# Patient Record
Sex: Female | Born: 2000 | Race: White | Hispanic: No | Marital: Single | Smoking: Never smoker
Health system: Southern US, Community
[De-identification: ages and names within clinical notes are randomized; demographics above are authoritative.]

## PROBLEM LIST (undated history)

## (undated) DIAGNOSIS — J45909 Unspecified asthma, uncomplicated: Secondary | ICD-10-CM

## (undated) HISTORY — DX: Unspecified asthma, uncomplicated: J45.909

---
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
## 2018-04-11 ENCOUNTER — Ambulatory Visit
Admission: RE | Admit: 2018-04-11 | Discharge: 2018-04-11 | Disposition: A | Discharge status: Home / Self Care | Payer: BLUE CROSS/BLUE SHIELD | Source: Ambulatory Visit | Attending: Pediatrics | Admitting: Pediatrics

## 2018-04-11 ENCOUNTER — Other Ambulatory Visit: Payer: Self-pay | Admitting: Pediatrics

## 2018-04-11 DIAGNOSIS — R059 Cough, unspecified: Secondary | ICD-10-CM

## 2018-04-11 DIAGNOSIS — R062 Wheezing: Secondary | ICD-10-CM

## 2018-04-11 DIAGNOSIS — R05 Cough: Secondary | ICD-10-CM

## 2018-04-17 ENCOUNTER — Encounter: Payer: Self-pay | Admitting: Allergy & Immunology

## 2018-04-17 ENCOUNTER — Ambulatory Visit (INDEPENDENT_AMBULATORY_CARE_PROVIDER_SITE_OTHER): Payer: BLUE CROSS/BLUE SHIELD | Admitting: Allergy & Immunology

## 2018-04-17 VITALS — BP 110/68 | HR 85 | Temp 98.0°F | Resp 18 | Ht 68.0 in | Wt 155.2 lb

## 2018-04-17 DIAGNOSIS — J302 Other seasonal allergic rhinitis: Secondary | ICD-10-CM | POA: Diagnosis not present

## 2018-04-17 DIAGNOSIS — J4540 Moderate persistent asthma, uncomplicated: Secondary | ICD-10-CM

## 2018-04-17 DIAGNOSIS — J3089 Other allergic rhinitis: Secondary | ICD-10-CM

## 2018-04-17 MED ORDER — CETIRIZINE HCL 10 MG PO TABS: 10.0000 mg | ORAL_TABLET | Freq: Every day | ORAL | 5 refills | Status: DC

## 2018-04-17 MED ORDER — MONTELUKAST SODIUM 10 MG PO TABS: 10.0000 mg | ORAL_TABLET | Freq: Every day | ORAL | 5 refills | Status: DC

## 2018-04-17 MED ORDER — BUDESONIDE-FORMOTEROL FUMARATE 160-4.5 MCG/ACT IN AERO: 2.0000 | INHALATION_SPRAY | Freq: Two times a day (BID) | RESPIRATORY_TRACT | 5 refills | Status: DC

## 2018-04-17 NOTE — Progress Notes (Signed)
NEW PATIENT  Date of Service/Encounter:  04/17/18  Referring provider: Dion Body, MD   Assessment:   Moderate persistent asthma, uncomplicated  Seasonal and perennial allergic rhinitis    Asthma Reportables:  Severity: moderate persistent  Risk: high Control: not well controlled   Plan/Recommendations:   1. Moderate persistent asthma, uncomplicated - Lung testing looks slightly lower today, but there was an improvement with the nebulizer treatment. - Because you are needing albuterol so frequently, we are going to go ahead and start a combined inhaled steroid and long-acting albuterol: Symbicort  - This will help decrease the number of albuterol doses you need each day. - Spacer sample and demonstration provided. - Daily controller medication(s): Symbicort 160/4.73mg two puffs twice daily with spacer - Prior to physical activity: ProAir 2 puffs 10-15 minutes before physical activity. - Rescue medications: ProAir 4 puffs every 4-6 hours as needed - Asthma control goals:  * Full participation in all desired activities (may need albuterol before activity) * Albuterol use two time or less a week on average (not counting use with activity) * Cough interfering with sleep two time or less a month * Oral steroids no more than once a year * No hospitalizations  2. Chronic rhinitis - Testing today showed: horse, mouse, mixed feather, trees, weeds, grasses, indoor molds, dust mites, cat and dog - Avoidance measures provided. - Start taking: Singulair (montelukast) 128mdaily and Flonase (fluticasone) two sprays per nostril daily and Zyrtec (cetirizine) 1078maily as needed.  - You can use an extra dose of the antihistamine, if needed, for breakthrough symptoms.  - Consider nasal saline rinses 1-2 times daily to remove allergens from the nasal cavities as well as help with mucous clearance (this is especially helpful to do before the nasal sprays are given) - Consider allergy  shots as a means of long-term control. - Allergy shots "re-train" and "reset" the immune system to ignore environmental allergens and decrease the resulting immune response to those allergens (sneezing, itchy watery eyes, runny nose, nasal congestion, etc).    - Allergy shots improve symptoms in 75-85% of patients.  - We can discuss more at the next appointment if the medications are not working for you.  3. Return in about 1 month (around 05/15/2018) at 3:45pm.  Subjective:   HanXUAN MATEUS a [age]o. female presenting today for evaluation of  Chief Complaint  Patient presents with  . Asthma  . Snoring  . Headache  . Nasal Congestion    HanIZABELLAH DADISMANs a history of the following: Patient Active Problem List   Diagnosis Date Noted  . Seasonal and perennial allergic rhinitis 04/17/2018  . Moderate persistent asthma, uncomplicated 04/21/83/1660 History obtained from: chart review and patient and her mother.  HanMarya Landrys referred by ReiDion BodyD.     HanAshiah a [age] female presenting for an evaluation of asthma and allergies.   Asthma/Respiratory Symptom History: She was diagnosed when she was around [age] years of age. She received a nebulizer at that time. She did go to the ED several times during weather changes. There was one episode when she needed an ambulance. Overall symptoms have improved. She is now using her albuterol up to three times daily on a good day. She takes it everywhere with her. She was on Qvar at one point when she was in middle school and she "did not like that one". She was even on Advair at some  point sometime around middle school. She just received prednisone for three days before this visit. She reports coughing more nights than not.   Allergic Rhinitis Symptom History: Lately she has had snoring and congestion. The snoring has worsened over the last two years. Patient reports poor sleep. She did go to see ENT (Dr. Polly Cobia). She did have her  adenoids evaluated and this was normal. She was diagnosed with a slightly deviated septum and chronic sinusitis. She was treated with Augmentin. Symptoms persist throughout the entire year. She was tested at one point, although she cannot remember when this happened. It was at an allergy appointment in [place]. She was on allergy shots for one year, but she is unsure how far along she got with this. She is unsure whether they helped at all.   Otherwise, there is no history of other atopic diseases, including drug allergies, stinging insect allergies, or urticaria. There is no significant infectious history. Vaccinations are up to date.   She currently attends [employer]. She is majoring in nursing.    Past Medical History: Patient Active Problem List   Diagnosis Date Noted  . Seasonal and perennial allergic rhinitis 04/17/2018  . Moderate persistent asthma, uncomplicated 65/99/1992    Medication List:  Allergies as of 04/17/2018   No Known Allergies     Medication List        Accurate as of 04/17/18 12:45 PM. Always use your most recent med list.          albuterol 108 (90 Base) MCG/ACT inhaler Commonly known as:  PROVENTIL HFA;VENTOLIN HFA Inhale into the lungs.   budesonide-formoterol 160-4.5 MCG/ACT inhaler Commonly known as:  SYMBICORT Inhale 2 puffs into the lungs 2 (two) times daily.   cetirizine 10 MG tablet Commonly known as:  ZYRTEC Take 1 tablet (10 mg total) by mouth daily.   fluticasone 50 MCG/ACT nasal spray Commonly known as:  FLONASE SHAKE LQ AND U 2 SPRAYS IEN D   montelukast 10 MG tablet Commonly known as:  SINGULAIR Take 1 tablet (10 mg total) by mouth at bedtime.       Birth History: non-contributory. Born at term without complications.   Developmental History: Andria has met all milestones on time. She has required no speech therapy, occupational therapy, or physical therapy.   Past Surgical History: History reviewed. No pertinent  surgical history.   Family History: Family History  Problem Relation Age of Onset  . Asthma Mother   . Allergic rhinitis Mother   . Allergic rhinitis Father   . Asthma Sister   . Allergic rhinitis Sister      Social History: Sydelle lives at home with her mother, father, and dogs. They moved to a different house in the recent past. She has the snoring at her house as well as her apartment in college. They currently live in a house that is 19 years old. There is hardwood throughout the home and carpeting in the bedrooms. They have gas heating and central cooling. There are five dogs in the home, and one of them goes into the bedrooms occasionally. There are no dust mite coverings of the bedding. There is no tobacco exposure in the home. She is currently pursuing a degree in nursing from [employer].     Review of Systems: a 14-point review of systems is pertinent for what is mentioned in HPI.  Otherwise, all other systems were negative. Constitutional: negative other than that listed in the HPI Eyes: negative other than that  listed in the HPI Ears, nose, mouth, throat, and face: negative other than that listed in the HPI Respiratory: negative other than that listed in the HPI Cardiovascular: negative other than that listed in the HPI Gastrointestinal: negative other than that listed in the HPI Genitourinary: negative other than that listed in the HPI Integument: negative other than that listed in the HPI Hematologic: negative other than that listed in the HPI Musculoskeletal: negative other than that listed in the HPI Neurological: negative other than that listed in the HPI Allergy/Immunologic: negative other than that listed in the HPI    Objective:   Blood pressure 110/68, pulse 85, temperature 98 F (36.7 C), resp. rate 18, height '5\' 8"'  (1.727 m), weight 155 lb 3.2 oz (70.4 kg), last menstrual period 03/22/2018, SpO2 95 %. Body mass index is 23.6 kg/m.   Physical  Exam:  General: Alert, interactive, in no acute distress. Pleasant female. Laughing.  Eyes: No conjunctival injection bilaterally, no discharge on the right, no discharge on the left, no Horner-Trantas dots present and allergic shiners present bilaterally. PERRL bilaterally. EOMI without pain. No photophobia.  Ears: Right TM pearly gray with normal light reflex, Left TM pearly gray with normal light reflex, Right TM intact without perforation and Left TM intact without perforation.  Nose/Throat: External nose within normal limits and septum midline. Turbinates edematous and pale with clear discharge. Posterior oropharynx markedly erythematous with cobblestoning in the posterior oropharynx. Tonsils 2+ without exudates.  Tongue without thrush. Neck: Supple without thyromegaly. Trachea midline. Adenopathy: shoddy bilateral anterior cervical lymphadenopathy and no enlarged lymph nodes appreciated in the occipital, axillary, epitrochlear, inguinal, or popliteal regions. Lungs: Clear to auscultation without wheezing, rhonchi or rales. No increased work of breathing. CV: Normal S1/S2. No murmurs. Capillary refill <2 seconds.  Abdomen: Nondistended, nontender. No guarding or rebound tenderness. Bowel sounds present in all fields and hypoactive  Skin: Warm and dry, without lesions or rashes. Extremities:  No clubbing, cyanosis or edema. Neuro:   Grossly intact. No focal deficits appreciated. Responsive to questions.  Diagnostic studies:   Spirometry: results abnormal (FEV1: 2.46/65%, FVC: 3.85/89%, FEV1/FVC: 64%).    Spirometry consistent with mild obstructive disease. Albuterol nebulizer treatment given in clinic with improvement in FEV1, but not significant per ATS criteria.  Allergy Studies:   Indoor/Outdoor Percutaneous Adult Environmental Panel: positive to bahia grass, johnson grass, [place] blue grass, meadow fescue grass, perennial rye grass, timothy grass, burweed marsh elder, short ragweed,  lamb's quarters, red cedar, [place] cottonwood, [place] sycamore, black walnut pollen, cat, dog, mixed feather, horse and mouse. Otherwise negative with adequate controls.  Indoor/Outdoor Selected Intradermal Environmental Panel: positive to mold mix #2 and mold mix #4. Otherwise negative with adequate controls.    Allergy testing results were read and interpreted by myself, documented by clinical staff.       Salvatore Marvel, MD [facility]

## 2018-04-17 NOTE — Patient Instructions (Addendum)
1. Moderate persistent asthma, uncomplicated - Lung testing looks slightly lower today, but there was an improvement with the nebulizer treatment. - Because you are needing albuterol so frequently, we are going to go ahead and start a combined inhaled steroid and long-acting albuterol: Symbicort  - This will help decrease the number of albuterol doses you need each day. - Spacer sample and demonstration provided. - Daily controller medication(s): Symbicort 160/4.175mcg two puffs twice daily with spacer - Prior to physical activity: ProAir 2 puffs 10-15 minutes before physical activity. - Rescue medications: ProAir 4 puffs every 4-6 hours as needed - Asthma control goals:  * Full participation in all desired activities (may need albuterol before activity) * Albuterol use two time or less a week on average (not counting use with activity) * Cough interfering with sleep two time or less a month * Oral steroids no more than once a year * No hospitalizations  2. Chronic rhinitis - Testing today showed: horse, mouse, mixed feather, trees, weeds, grasses, indoor molds, dust mites, cat and dog - Avoidance measures provided. - Start taking: Singulair (montelukast) 10mg  daily and Flonase (fluticasone) two sprays per nostril daily and Zyrtec (cetirizine) 10mg  daily as needed.  - You can use an extra dose of the antihistamine, if needed, for breakthrough symptoms.  - Consider nasal saline rinses 1-2 times daily to remove allergens from the nasal cavities as well as help with mucous clearance (this is especially helpful to do before the nasal sprays are given) - Consider allergy shots as a means of long-term control. - Allergy shots "re-train" and "reset" the immune system to ignore environmental allergens and decrease the resulting immune response to those allergens (sneezing, itchy watery eyes, runny nose, nasal congestion, etc).    - Allergy shots improve symptoms in 75-85% of patients.  - We can discuss  more at the next appointment if the medications are not working for you.  3. Return in about 1 month (around 05/15/2018) at 3:45pm.   Please inform us of any Emergency Department visits, hospitalizations, or changes in symptoms. Call us before going to the ED for breathing or allergy symptoms since we might be able to fit you in for a sick visit. Feel free to contact us anytime with any questions, problems, or concerns.  It was a pleasure to meet you and your family today!  Websites that have reliable patient information: 1. American Academy of Asthma, Allergy, and Immunology: www.aaaai.org 2. Food Allergy Research and Education (FARE): foodallergy.org 3. Mothers of Asthmatics: http://www.asthmacommunitynetwork.org 4. American College of Allergy, Asthma, and Immunology: MissingWeapons.cawww.acaai.org   Make sure you are registered to vote! If you have moved or changed any of your contact information, you will need to get this updated before voting!        Reducing Pollen Exposure  The American Academy of Allergy, Asthma and Immunology suggests the following steps to reduce your exposure to pollen during allergy seasons.    1. Do not hang sheets or clothing out to dry; pollen may collect on these items. 2. Do not mow lawns or spend time around freshly cut grass; mowing stirs up pollen. 3. Keep windows closed at night.  Keep car windows closed while driving. 4. Minimize morning activities outdoors, a time when pollen counts are usually at their highest. 5. Stay indoors as much as possible when pollen counts or humidity is high and on windy days when pollen tends to remain in the air longer. 6. Use air conditioning when possible.  Many air conditioners have filters that trap the pollen spores. 7. Use a HEPA room air filter to remove pollen form the indoor air you breathe.  Control of Mold Allergen   Mold and fungi can grow on a variety of surfaces provided certain temperature and moisture conditions  exist.  Outdoor molds grow on plants, decaying vegetation and soil.  The major outdoor mold, Alternaria and Cladosporium, are found in very high numbers during hot and dry conditions.  Generally, a late Summer - Fall peak is seen for common outdoor fungal spores.  Rain will temporarily lower outdoor mold spore count, but counts rise rapidly when the rainy period ends.  The most important indoor molds are Aspergillus and Penicillium.  Dark, humid and poorly ventilated basements are ideal sites for mold growth.  The next most common sites of mold growth are the bathroom and the kitchen.  Indoor (Perennial) Mold Control   Positive indoor molds via skin testing: Aspergillus, Penicillium, Fusarium, Aureobasidium (Pullulara) and Rhizopus  1. Maintain humidity below 50%. 2. Clean washable surfaces with 5% bleach solution. 3. Remove sources e.g. contaminated carpets.     Control of Dog or Cat Allergen  Avoidance is the best way to manage a dog or cat allergy. If you have a dog or cat and are allergic to dog or cats, consider removing the dog or cat from the home. If you have a dog or cat but don't want to find it a new home, or if your family wants a pet even though someone in the household is allergic, here are some strategies that may help keep symptoms at bay:  1. Keep the pet out of your bedroom and restrict it to only a few rooms. Be advised that keeping the dog or cat in only one room will not limit the allergens to that room. 2. Don't pet, hug or kiss the dog or cat; if you do, wash your hands with soap and water. 3. High-efficiency particulate air (HEPA) cleaners run continuously in a bedroom or living room can reduce allergen levels over time. 4. Regular use of a high-efficiency vacuum cleaner or a central vacuum can reduce allergen levels. 5. Giving your dog or cat a bath at least once a week can reduce airborne allergen.  Allergy Shots   Allergies are the result of a chain reaction that  starts in the immune system. Your immune system controls how your body defends itself. For instance, if you have an allergy to pollen, your immune system identifies pollen as an invader or allergen. Your immune system overreacts by producing antibodies called Immunoglobulin E (IgE). These antibodies travel to cells that release chemicals, causing an allergic reaction.  The concept behind allergy immunotherapy, whether it is received in the form of shots or tablets, is that the immune system can be desensitized to specific allergens that trigger allergy symptoms. Although it requires time and patience, the payback can be long-term relief.  How Do Allergy Shots Work?  Allergy shots work much like a vaccine. Your body responds to injected amounts of a particular allergen given in increasing doses, eventually developing a resistance and tolerance to it. Allergy shots can lead to decreased, minimal or no allergy symptoms.  There generally are two phases: build-up and maintenance. Build-up often ranges from three to six months and involves receiving injections with increasing amounts of the allergens. The shots are typically given once or twice a week, though more rapid build-up schedules are sometimes used.  The maintenance phase  begins when the most effective dose is reached. This dose is different for each person, depending on how allergic you are and your response to the build-up injections. Once the maintenance dose is reached, there are longer periods between injections, typically two to four weeks.  Occasionally doctors give cortisone-type shots that can temporarily reduce allergy symptoms. These types of shots are different and should not be confused with allergy immunotherapy shots.  Who Can Be Treated with Allergy Shots?  Allergy shots may be a good treatment approach for people with allergic rhinitis (hay fever), allergic asthma, conjunctivitis (eye allergy) or stinging insect allergy.    Before deciding to begin allergy shots, you should consider:  . The length of allergy season and the severity of your symptoms . Whether medications and/or changes to your environment can control your symptoms . Your desire to avoid long-term medication use . Time: allergy immunotherapy requires a major time commitment . Cost: may vary depending on your insurance coverage  Allergy shots for children age 105 and older are effective and often well tolerated. They might prevent the onset of new allergen sensitivities or the progression to asthma.  Allergy shots are not started on patients who are pregnant but can be continued on patients who become pregnant while receiving them. In some patients with other medical conditions or who take certain common medications, allergy shots may be of risk. It is important to mention other medications you talk to your allergist.   When Will I Feel Better?  Some may experience decreased allergy symptoms during the build-up phase. For others, it may take as long as 12 months on the maintenance dose. If there is no improvement after a year of maintenance, your allergist will discuss other treatment options with you.  If you aren't responding to allergy shots, it may be because there is not enough dose of the allergen in your vaccine or there are missing allergens that were not identified during your allergy testing. Other reasons could be that there are high levels of the allergen in your environment or major exposure to non-allergic triggers like tobacco smoke.  What Is the Length of Treatment?  Once the maintenance dose is reached, allergy shots are generally continued for three to five years. The decision to stop should be discussed with your allergist at that time. Some people may experience a permanent reduction of allergy symptoms. Others may relapse and a longer course of allergy shots can be considered.  What Are the Possible Reactions?  The two  types of adverse reactions that can occur with allergy shots are local and systemic. Common local reactions include very mild redness and swelling at the injection site, which can happen immediately or several hours after. A systemic reaction, which is less common, affects the entire body or a particular body system. They are usually mild and typically respond quickly to medications. Signs include increased allergy symptoms such as sneezing, a stuffy nose or hives.  Rarely, a serious systemic reaction called anaphylaxis can develop. Symptoms include swelling in the throat, wheezing, a feeling of tightness in the chest, nausea or dizziness. Most serious systemic reactions develop within 30 minutes of allergy shots. This is why it is strongly recommended you wait in your doctor's office for 30 minutes after your injections. Your allergist is trained to watch for reactions, and his or her staff is trained and equipped with the proper medications to identify and treat them.  Who Should Administer Allergy Shots?  The preferred location for  receiving shots is your prescribing allergist's office. Injections can sometimes be given at another facility where the physician and staff are trained to recognize and treat reactions, and have received instructions by your prescribing allergist.

## 2018-05-15 ENCOUNTER — Ambulatory Visit (INDEPENDENT_AMBULATORY_CARE_PROVIDER_SITE_OTHER): Payer: BLUE CROSS/BLUE SHIELD | Admitting: Allergy & Immunology

## 2018-05-15 ENCOUNTER — Encounter: Payer: Self-pay | Admitting: Allergy & Immunology

## 2018-05-15 VITALS — BP 108/70 | HR 100 | Temp 98.7°F | Resp 16 | Ht 68.0 in | Wt 151.0 lb

## 2018-05-15 DIAGNOSIS — J302 Other seasonal allergic rhinitis: Secondary | ICD-10-CM | POA: Diagnosis not present

## 2018-05-15 DIAGNOSIS — J4540 Moderate persistent asthma, uncomplicated: Secondary | ICD-10-CM

## 2018-05-15 DIAGNOSIS — J3089 Other allergic rhinitis: Secondary | ICD-10-CM | POA: Diagnosis not present

## 2018-05-15 NOTE — Patient Instructions (Addendum)
1. Moderate persistent asthma, uncomplicated - Lung testing looked amazing today. - I am glad that you are feeling better.  - Daily controller medication(s): Symbicort 160/4.465mcg two puffs twice daily with spacer - Prior to physical activity: ProAir 2 puffs 10-15 minutes before physical activity. - Rescue medications: ProAir 4 puffs every 4-6 hours as needed - Asthma control goals:  * Full participation in all desired activities (may need albuterol before activity) * Albuterol use two time or less a week on average (not counting use with activity) * Cough interfering with sleep two time or less a month * Oral steroids no more than once a year * No hospitalizations  2. Chronic rhinitis (horse, mouse, mixed feather, trees, weeds, grasses, indoor molds, dust mites, cat and dog) - Continue taking: Singulair (montelukast) 10mg  daily and Flonase (fluticasone) two sprays per nostril daily and Zyrtec (cetirizine) 10mg  daily as needed.  - You can wean off of these as tolerated, but beware since you are allergic to perennial and seasonal allergens.  - You can use an extra dose of the antihistamine, if needed, for breakthrough symptoms.  - Consider nasal saline rinses 1-2 times daily to remove allergens from the nasal cavities as well as help with mucous clearance (this is especially helpful to do before the nasal sprays are given) - Consider allergy shots as a means of long-term control.  3. Return in about 4 months (around 09/14/2018).   Please inform us of any Emergency Department visits, hospitalizations, or changes in symptoms. Call us before going to the ED for breathing or allergy symptoms since we might be able to fit you in for a sick visit. Feel free to contact us anytime with any questions, problems, or concerns.  It was a pleasure to see you again today! I am glad that you are feeling good today!   Websites that have reliable patient information: 1. American Academy of Asthma, Allergy,  and Immunology: www.aaaai.org 2. Food Allergy Research and Education (FARE): foodallergy.org 3. Mothers of Asthmatics: http://www.asthmacommunitynetwork.org 4. American College of Allergy, Asthma, and Immunology: MissingWeapons.cawww.acaai.org   Make sure you are registered to vote! If you have moved or changed any of your contact information, you will need to get this updated before voting!

## 2018-05-15 NOTE — Progress Notes (Signed)
FOLLOW UP  Date of Service/Encounter:  05/15/18   Assessment:   Moderate persistent asthma, uncomplicated  Seasonal and perennial allergic rhinitis (horse, mouse, mixed feather, trees, weeds, grasses, indoor molds, dust mites, cat and dog)    Asthma Reportables:  Severity: moderate persistent  Risk: high Control: not well controlled   Plan/Recommendations:   1. Moderate persistent asthma, uncomplicated - Lung testing looked amazing today. - I am glad that you are feeling better.  - Daily controller medication(s): Symbicort 160/4.75mcg two puffs twice daily with spacer - Prior to physical activity: ProAir 2 puffs 10-15 minutes before physical activity. - Rescue medications: ProAir 4 puffs every 4-6 hours as needed - Asthma control goals:  * Full participation in all desired activities (may need albuterol before activity) * Albuterol use two time or less a week on average (not counting use with activity) * Cough interfering with sleep two time or less a month * Oral steroids no more than once a year * No hospitalizations  2. Chronic rhinitis (horse, mouse, mixed feather, trees, weeds, grasses, indoor molds, dust mites, cat and dog) - Continue taking: Singulair (montelukast) 10mg  daily and Flonase (fluticasone) two sprays per nostril daily and Zyrtec (cetirizine) 10mg  daily as needed.  - You can wean off of these as tolerated, but beware since you are allergic to perennial and seasonal allergens.  - You can use an extra dose of the antihistamine, if needed, for breakthrough symptoms.  - Consider nasal saline rinses 1-2 times daily to remove allergens from the nasal cavities as well as help with mucous clearance (this is especially helpful to do before the nasal sprays are given) - Consider allergy shots as a means of long-term control.  3. Return in about 4 months (around 09/14/2018).   Subjective:   Lydia Brown is a 17 y.o. female presenting today for follow up of    Chief Complaint  Patient presents with  . Asthma    Lydia DupreHannah J Brown has a history of the following: Patient Active Problem List   Diagnosis Date Noted  . Seasonal and perennial allergic rhinitis 04/17/2018  . Moderate persistent asthma, uncomplicated 04/17/2018    History obtained from: chart review and patient and her mother.  Lydia AlbertsHannah J Brown's Primary Care Provider is Diamantina Monkseid, Maria, MD.     Lydia ClientHannah is a 17 y.o. female presenting for a follow up visit.  She was last seen in July 2019 as a new patient.  At that time, her lung function looked low, but there was improvement with albuterol.  Because she was needing albuterol so frequently, we started Symbicort 160/4.5 mcg 2 puffs twice daily.  She had testing that was positive to horse, mouse, mixed feather, trees, weeds, grasses, indoor molds, dust mites, cat and dog.  We started her on Singulair, Flonase, and Zyrtec.  We did discuss allergy shots as a means of long-term control.  Since the last visit, she has done well. She is using her Symbicort two puffs twice daily.  She has been quite impressed with how well she is feeling on this.  She used her albuterol only one time since I saw her.  She is able to exercise more readily, and in fact is now meeting her mother during the runs.  She has less nighttime coughing.  She has required no prednisone or ER visits.  ACT score today is 16, indicating subpar asthma control.  This is likely related to the fact that the time course of the survey  spans prior to when I first saw her.  Her allergic rhinitis symptoms are controlled with Flonase, cetirizine, and montelukast.  She has thought about allergy shots, and remains interested in them.  However, she is wondering how she will get them when she is at school.  I did spend some time discussing the fact that we can give allergy shots through the health clinic at school.  She seemed interested in this and will consider in the future.  Since last visit, she did  go to [place] with her mother to visit her older sister, who is doing an internship with the [employer].  She will be going back to school this coming week.  Otherwise, there have been no changes to her past medical history, surgical history, family history, or social history.    Review of Systems: a 14-point review of systems is pertinent for what is mentioned in HPI.  Otherwise, all other systems were negative. Constitutional: negative other than that listed in the HPI Eyes: negative other than that listed in the HPI Ears, nose, mouth, throat, and face: negative other than that listed in the HPI Respiratory: negative other than that listed in the HPI Cardiovascular: negative other than that listed in the HPI Gastrointestinal: negative other than that listed in the HPI Genitourinary: negative other than that listed in the HPI Integument: negative other than that listed in the HPI Hematologic: negative other than that listed in the HPI Musculoskeletal: negative other than that listed in the HPI Neurological: negative other than that listed in the HPI Allergy/Immunologic: negative other than that listed in the HPI    Objective:   Blood pressure 108/70, pulse 100, temperature 98.7 F (37.1 C), temperature source Oral, resp. rate 16, height 5\' 8"  (1.727 m), weight 151 lb (68.5 kg), last menstrual period 05/11/2018, SpO2 98 %. Body mass index is 22.96 kg/m.   Physical Exam:  General: Alert, interactive, in no acute distress. Pleasant female. Talkative.  Eyes: No conjunctival injection bilaterally, no discharge on the right, no discharge on the left and no Horner-Trantas dots present. PERRL bilaterally. EOMI without pain. No photophobia.  Ears: Right TM pearly gray with normal light reflex, Left TM pearly gray with normal light reflex, Right TM intact without perforation and Left TM intact without perforation.  Nose/Throat: External nose within normal limits and  septum midline. Turbinates edematous and pale with clear discharge. Posterior oropharynx erythematous without cobblestoning in the posterior oropharynx. Tonsils 2+ without exudates.  Tongue without thrush. Lungs: Clear to auscultation without wheezing, rhonchi or rales. No increased work of breathing. CV: Normal S1/S2. No murmurs. Capillary refill <2 seconds.  Skin: Warm and dry, without lesions or rashes. Neuro:   Grossly intact. No focal deficits appreciated. Responsive to questions.  Diagnostic studies:   Spirometry: results normal (FEV1: 3.95/106%, FVC: 4.89/114%, FEV1/FVC: 81%).    Spirometry consistent with normal pattern.   Allergy Studies: none      Malachi BondsJoel Greysyn Vanderberg, MD  [facility]

## 2018-10-11 ENCOUNTER — Telehealth: Payer: Self-pay

## 2018-10-11 NOTE — Telephone Encounter (Signed)
Patient brought back her forms to start allergy injections. Patient has schedule appt to start allergy injection on 10/23/2018. Can you place the order for these. Thank You.

## 2018-10-15 NOTE — Addendum Note (Signed)
Addended by: Alfonse Spruce on: 10/15/2018 02:37 PM   Modules accepted: Orders

## 2018-10-15 NOTE — Progress Notes (Signed)
We received notification from Lydia Brown that she is interested in pursuing allergen immunotherapy. Prescriptions written and routed to the Immunotherapy Team.   Malachi Bonds, MD [facility]

## 2018-10-16 NOTE — Progress Notes (Signed)
EXP 10/19/19

## 2018-10-19 DIAGNOSIS — J301 Allergic rhinitis due to pollen: Secondary | ICD-10-CM

## 2018-10-20 DIAGNOSIS — J3089 Other allergic rhinitis: Secondary | ICD-10-CM

## 2018-10-23 ENCOUNTER — Ambulatory Visit: Payer: BLUE CROSS/BLUE SHIELD

## 2018-11-24 ENCOUNTER — Ambulatory Visit: Payer: BLUE CROSS/BLUE SHIELD

## 2018-12-22 ENCOUNTER — Telehealth: Payer: Self-pay

## 2018-12-22 ENCOUNTER — Other Ambulatory Visit: Payer: Self-pay | Admitting: Allergy & Immunology

## 2018-12-22 MED ORDER — BUDESONIDE-FORMOTEROL FUMARATE 160-4.5 MCG/ACT IN AERO: 2.0000 | INHALATION_SPRAY | Freq: Two times a day (BID) | RESPIRATORY_TRACT | 0 refills | Status: DC

## 2018-12-22 MED ORDER — MONTELUKAST SODIUM 10 MG PO TABS: 10.0000 mg | ORAL_TABLET | Freq: Every day | ORAL | 0 refills | Status: DC

## 2018-12-22 NOTE — Telephone Encounter (Signed)
Mom called and said pharmacy faxed a request for prescriptions on 12-19-18 and they were never filled. I looked up her last visit and it was 05-15-18. I said that might be the reason. Patient is in [place] waiting on these prescriptions before she comes home. Needs Montelukast and Symbicort sent in to [facility] in [place] on [address].

## 2018-12-22 NOTE — Telephone Encounter (Signed)
Courtesy refill sent in for Montelukast and Symbicort, pt needs to schedule an OV.

## 2018-12-22 NOTE — Telephone Encounter (Signed)
Refill sent in to [facility]

## 2018-12-25 ENCOUNTER — Other Ambulatory Visit: Payer: Self-pay | Admitting: *Deleted

## 2018-12-26 ENCOUNTER — Other Ambulatory Visit: Payer: Self-pay

## 2018-12-26 ENCOUNTER — Ambulatory Visit (INDEPENDENT_AMBULATORY_CARE_PROVIDER_SITE_OTHER): Payer: BLUE CROSS/BLUE SHIELD | Admitting: *Deleted

## 2018-12-26 DIAGNOSIS — J3089 Other allergic rhinitis: Secondary | ICD-10-CM | POA: Diagnosis not present

## 2018-12-26 DIAGNOSIS — J302 Other seasonal allergic rhinitis: Secondary | ICD-10-CM

## 2018-12-26 MED ORDER — EPINEPHRINE 0.3 MG/0.3ML IJ SOAJ: 0.3000 mg | INTRAMUSCULAR | 2 refills | Status: DC | PRN

## 2018-12-26 NOTE — Progress Notes (Signed)
Immunotherapy   Patient Details  Name: Lydia Brown MRN: [id number] Date of Birth: 2001-04-07  12/26/2018  [name] started injections for  G-W-T-C-D/MOLD-RW Following schedule: B  Frequency:2 times per week Epi-Pen:Epi-Pen Available  Consent signed and patient instructions given.   [name] 12/26/2018, 2:27 PM

## 2018-12-26 NOTE — Progress Notes (Signed)
Signed order.  Malachi Bonds, MD [facility]

## 2019-01-23 IMAGING — CR DG CHEST 2V
2 series · 2 of 2 positions shown · non-contrast
Comparison: None.

CLINICAL DATA: Cough and wheezing.

EXAM:
CHEST - 2 VIEW

[w chest pa]
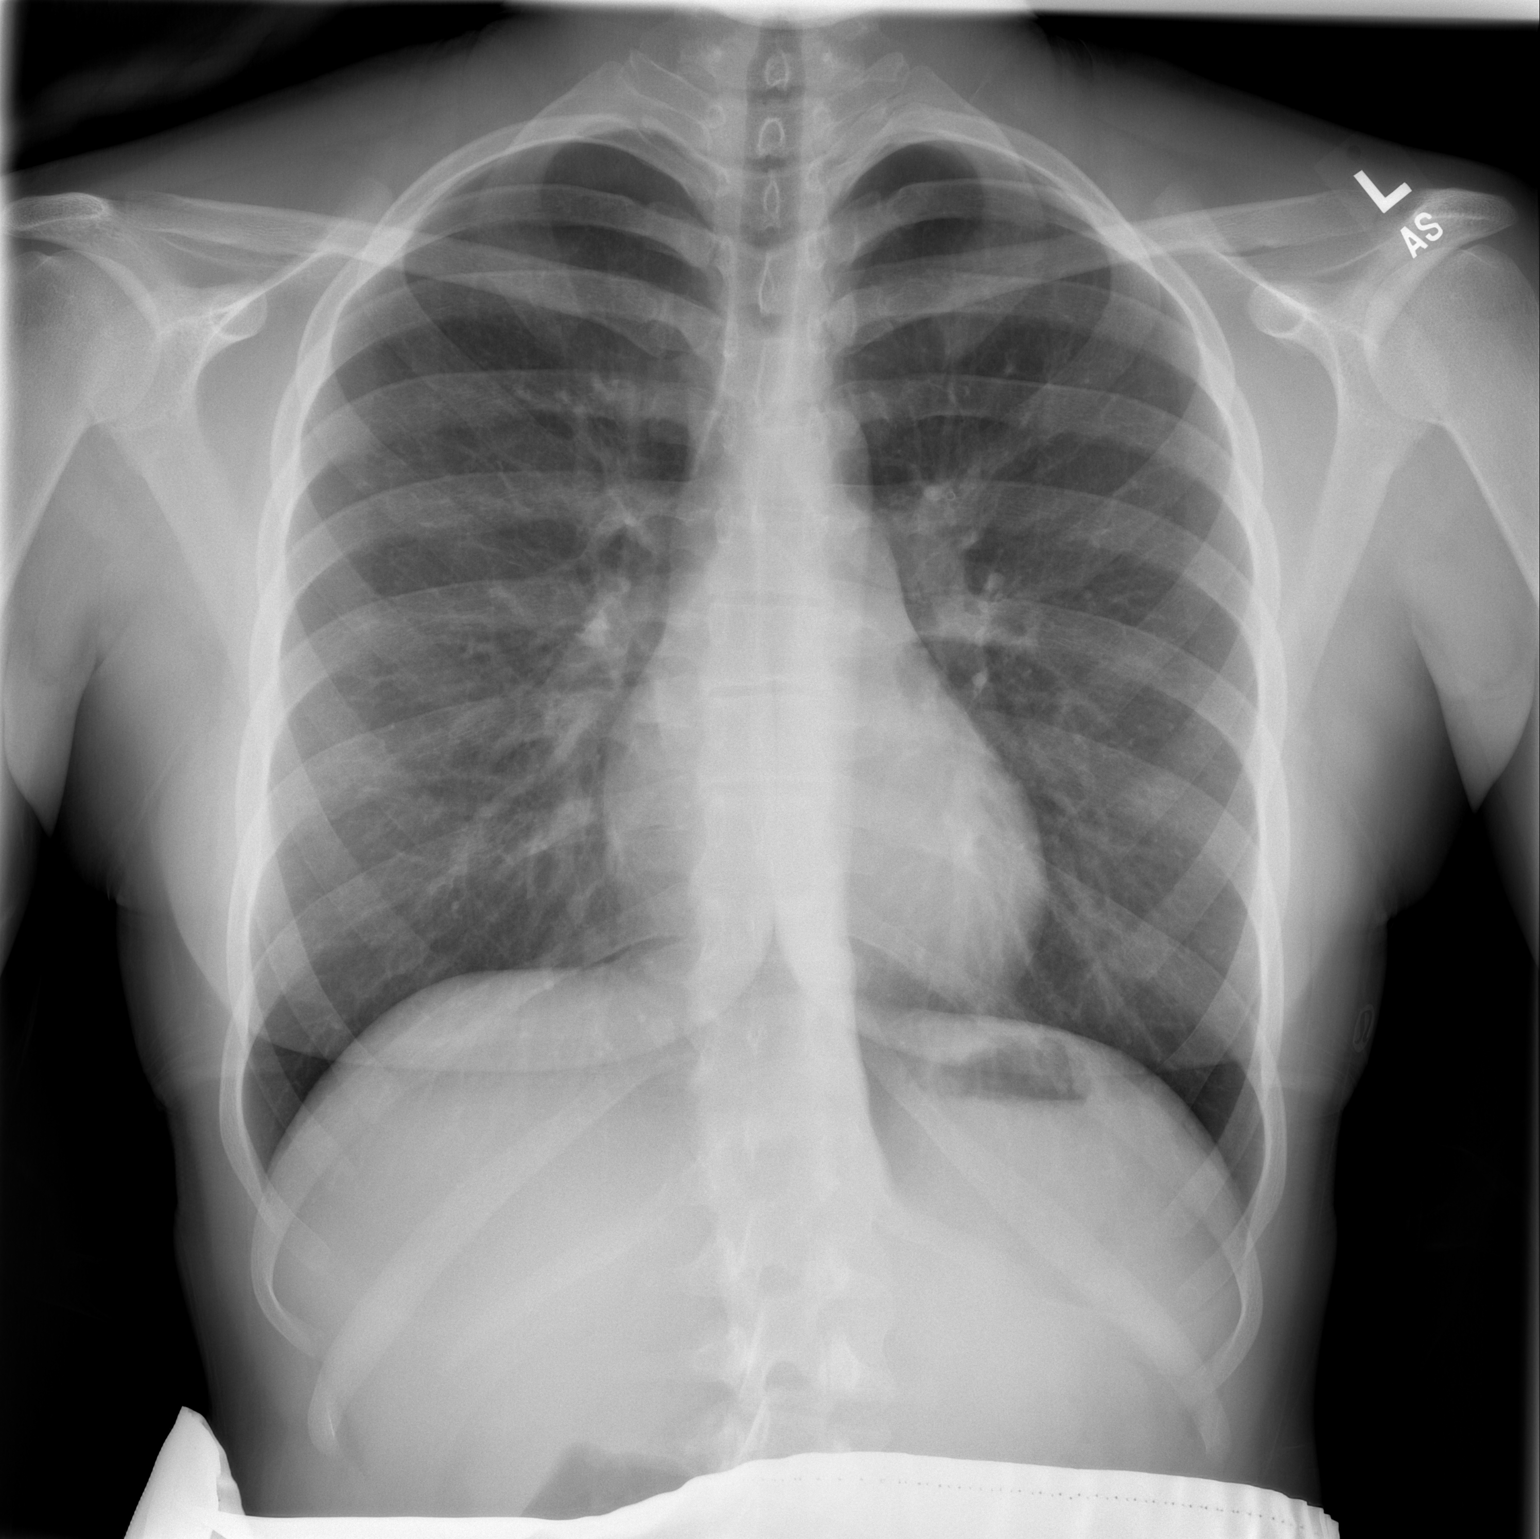

[w chest lat]
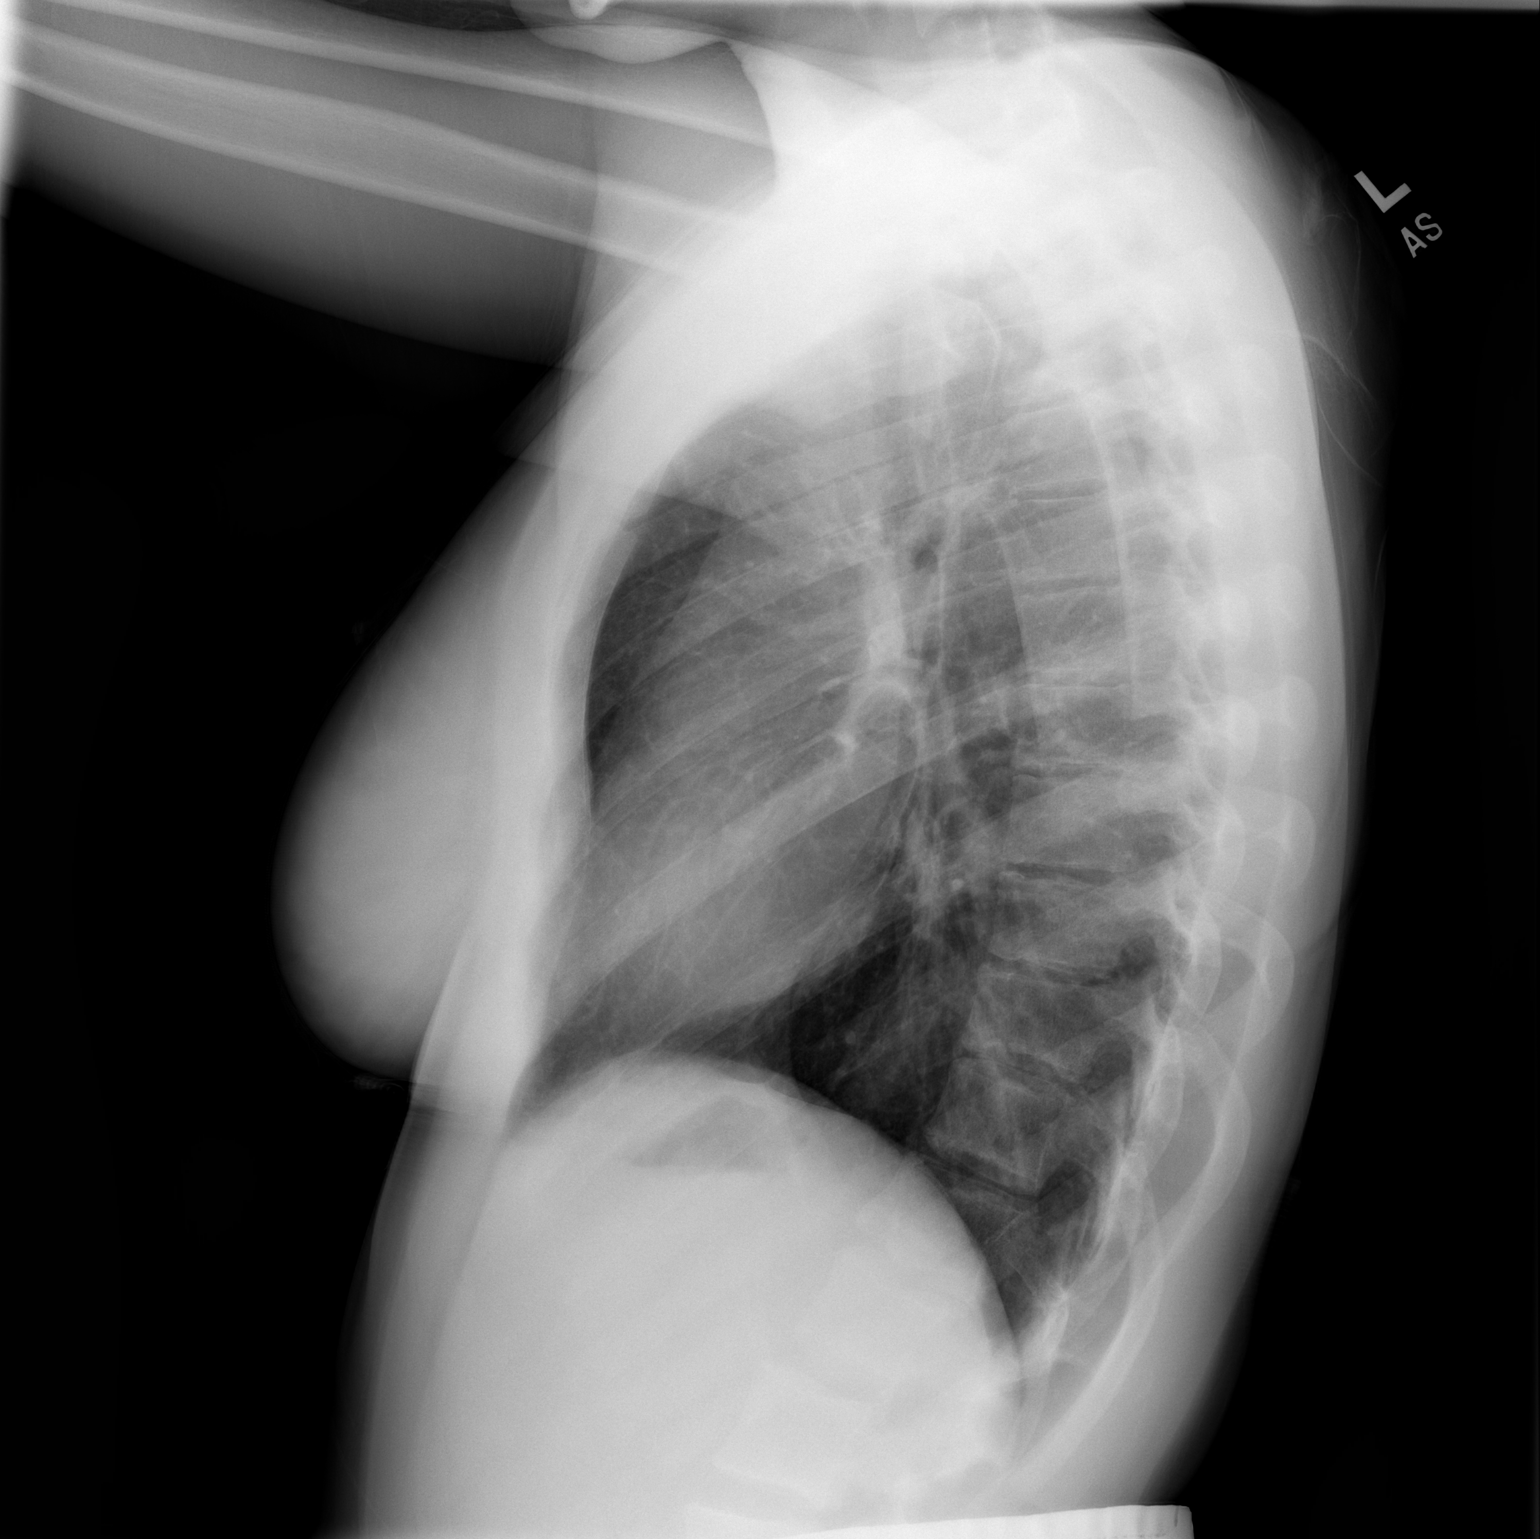

[2 of 2 positions shown; findings below may reference images not displayed]

FINDINGS: The heart size and mediastinal contours are within normal limits.
Both lungs are clear. Pectus deformity noted.
IMPRESSION: No active cardiopulmonary disease.

## 2019-03-29 ENCOUNTER — Ambulatory Visit: Payer: Managed Care, Other (non HMO) | Admitting: Allergy & Immunology

## 2019-03-29 ENCOUNTER — Encounter: Payer: Self-pay | Admitting: Allergy & Immunology

## 2019-03-29 ENCOUNTER — Other Ambulatory Visit: Payer: Self-pay

## 2019-03-29 VITALS — BP 102/80 | HR 80 | Temp 99.2°F | Resp 16 | Ht 67.32 in | Wt 154.0 lb

## 2019-03-29 DIAGNOSIS — J3089 Other allergic rhinitis: Secondary | ICD-10-CM

## 2019-03-29 DIAGNOSIS — J4540 Moderate persistent asthma, uncomplicated: Secondary | ICD-10-CM | POA: Diagnosis not present

## 2019-03-29 DIAGNOSIS — J302 Other seasonal allergic rhinitis: Secondary | ICD-10-CM

## 2019-03-29 MED ORDER — BUDESONIDE-FORMOTEROL FUMARATE 160-4.5 MCG/ACT IN AERO: 2.0000 | INHALATION_SPRAY | Freq: Two times a day (BID) | RESPIRATORY_TRACT | 5 refills | Status: DC

## 2019-03-29 MED ORDER — ALBUTEROL SULFATE HFA 108 (90 BASE) MCG/ACT IN AERS: 2.0000 | INHALATION_SPRAY | RESPIRATORY_TRACT | 1 refills | Status: DC | PRN

## 2019-03-29 MED ORDER — MONTELUKAST SODIUM 10 MG PO TABS: 10.0000 mg | ORAL_TABLET | Freq: Every day | ORAL | 5 refills | Status: DC

## 2019-03-29 NOTE — Progress Notes (Signed)
FOLLOW UP  Date of Service/Encounter:  03/29/19   Assessment:   Moderate persistent asthma, uncomplicated  Seasonal and perennial allergic rhinitis (horse, mouse, mixed feather, trees, weeds, grasses, indoor molds, dust mites, cat and dog)   Plan/Recommendations:   1. Moderate persistent asthma, uncomplicated - Lung testing looked amazing today.  - Daily controller medication(s): Symbicort 160/4.68mcg two puffs twice daily with spacer - Prior to physical activity: ProAir 2 puffs 10-15 minutes before physical activity. - Rescue medications: ProAir 4 puffs every 4-6 hours as needed - Asthma control goals:  * Full participation in all desired activities (may need albuterol before activity) * Albuterol use two time or less a week on average (not counting use with activity) * Cough interfering with sleep two time or less a month * Oral steroids no more than once a year * No hospitalizations  2. Chronic rhinitis (horse, mouse, mixed feather, trees, weeds, grasses, indoor molds, dust mites, cat and dog) - Continue taking: Singulair (montelukast) 10mg  daily and Flonase (fluticasone) two sprays per nostril daily and Zyrtec (cetirizine) 10mg  daily as needed.  - You can use an extra dose of the antihistamine, if needed, for breakthrough symptoms.  - Consider nasal saline rinses 1-2 times daily to remove allergens from the nasal cavities as well as help with mucous clearance (this is especially helpful to do before the nasal sprays are given) - We will make sure that the [facility] practice has signed all of the paperwork.   3. Return in about 6 months (around 09/28/2019). This can be an in-person, a virtual Webex or a telephone follow up visit.   Subjective:   Lydia Brown is a 18 y.o. female presenting today for follow up of  Chief Complaint  Patient presents with  . Asthma    having issues with chest tightness with exercise and coughing sometimes at night  . Allergic  Rhinitis     needs to take immunotherapy to [facility], needs vials    Lydia Brown has a history of the following: Patient Active Problem List   Diagnosis Date Noted  . Seasonal and perennial allergic rhinitis 04/17/2018  . Moderate persistent asthma, uncomplicated 78/29/5621    History obtained from: chart review and patient.  Lydia Brown is a 18 y.o. female presenting for a follow up visit. She was last seen in August 2019. At that time, her lung testing looked amazing. We continued the Symbicort two puffs twice daily as well as albuterol as needed. For her rhinitis, we continued Singulair 10mg  daily and Flonase two sprays per nostril daily and cetirizine 10mg  daily.   Asthma/Respiratory Symptom History: She remains on the Symbicort two puffs twice daily. She does not use her rescue inhaler much at all. Lydia Brown's asthma has been well controlled. She has not required rescue medication, experienced nocturnal awakenings due to lower respiratory symptoms, nor have activities of daily living been limited. She has required no Emergency Department or Urgent Care visits for her asthma. She has required zero courses of systemic steroids for asthma exacerbations since the last visit. ACT score today is 23, indicating excellent asthma symptom control.   Allergic Rhinitis Symptom History: She remains on allergen immunotherapy, although she has not been great about getting her allergy shots on a routine basis (in fact she only got one of her Blue . She has not required the use of antibiotics in quite some time. The only medications that she takes on a regular basis include Xyzal and Singulair. She does  have the nose spray but she does not use it daily. She is interested in transferring her shots to an [facility] in [place] where she is attending college. We do not have all of the paperwork in place at this time.   Lydia ClientHannah is on allergen immunotherapy. She receives two injections. Immunotherapy script #1  contains trees, weeds, grasses, cat and dog. She currently receives 0.4305mL of the BLUE vial (1/100,000). Immunotherapy script #2 contains ragweed and molds. She currently receives 0.405mL of the BLUE vial (1/100,000). She started shots March of 2020 and not yet reached maintenance.   Otherwise, there have been no changes to her past medical history, surgical history, family history, or social history.    Review of Systems  Constitutional: Negative.  Negative for chills, fever, malaise/fatigue and weight loss.  HENT: Negative.  Negative for congestion, ear discharge, ear pain and sore throat.   Eyes: Negative for pain, discharge and redness.  Respiratory: Positive for cough. Negative for sputum production, shortness of breath and wheezing.   Cardiovascular: Negative.  Negative for chest pain and palpitations.  Gastrointestinal: Negative for abdominal pain, heartburn, nausea and vomiting.  Skin: Negative.  Negative for itching and rash.  Neurological: Negative for dizziness and headaches.  Endo/Heme/Allergies: Positive for environmental allergies. Does not bruise/bleed easily.       Objective:   Blood pressure 102/80, pulse 80, temperature 99.2 F (37.3 C), temperature source Tympanic, resp. rate 16, height 5' 7.32" (1.71 m), weight 154 lb (69.9 kg), SpO2 98 %. Body mass index is 23.89 kg/m.   Physical Exam:  Physical Exam  Constitutional: She appears well-developed.  Pleasant female.   HENT:  Head: Normocephalic and atraumatic.  Right Ear: Tympanic membrane, external ear and ear canal normal.  Left Ear: Tympanic membrane, external ear and ear canal normal.  Nose: Mucosal edema present. No rhinorrhea, nasal deformity or septal deviation. No epistaxis. Right sinus exhibits no maxillary sinus tenderness and no frontal sinus tenderness. Left sinus exhibits no maxillary sinus tenderness and no frontal sinus tenderness.  Mouth/Throat: Uvula is midline and oropharynx is clear and moist.  Mucous membranes are not pale and not dry.  Cobblestoning present in the posterior oropharynx.   Eyes: Pupils are equal, round, and reactive to light. Conjunctivae and EOM are normal. Right eye exhibits no chemosis and no discharge. Left eye exhibits no chemosis and no discharge. Right conjunctiva is not injected. Left conjunctiva is not injected.  Cardiovascular: Normal rate, regular rhythm and normal heart sounds.  Respiratory: Effort normal and breath sounds normal. No accessory muscle usage. No tachypnea. No respiratory distress. She has no wheezes. She has no rhonchi. She has no rales. She exhibits no tenderness.  Moving air well in all lung fields.   Lymphadenopathy:    She has no cervical adenopathy.  Neurological: She is alert.  Skin: No abrasion, no petechiae and no rash noted. Rash is not papular, not vesicular and not urticarial. No erythema. No pallor.  Psychiatric: She has a normal mood and affect.     Diagnostic studies:    Spirometry: results normal (FEV1: 3.75/104%, FVC: 4.82/116%, FEV1/FVC: 78%).    Spirometry consistent with normal pattern.   Allergy Studies: none     Malachi BondsJoel Violeta Lecount, MD  [facility]

## 2019-03-29 NOTE — Patient Instructions (Addendum)
1. Moderate persistent asthma, uncomplicated - Lung testing looked amazing today.  - Daily controller medication(s): Symbicort 160/4.12mcg two puffs twice daily with spacer - Prior to physical activity: ProAir 2 puffs 10-15 minutes before physical activity. - Rescue medications: ProAir 4 puffs every 4-6 hours as needed - Asthma control goals:  * Full participation in all desired activities (may need albuterol before activity) * Albuterol use two time or less a week on average (not counting use with activity) * Cough interfering with sleep two time or less a month * Oral steroids no more than once a year * No hospitalizations  2. Chronic rhinitis (horse, mouse, mixed feather, trees, weeds, grasses, indoor molds, dust mites, cat and dog) - Continue taking: Singulair (montelukast) 10mg  daily and Flonase (fluticasone) two sprays per nostril daily and Zyrtec (cetirizine) 10mg  daily as needed.  - You can use an extra dose of the antihistamine, if needed, for breakthrough symptoms.  - Consider nasal saline rinses 1-2 times daily to remove allergens from the nasal cavities as well as help with mucous clearance (this is especially helpful to do before the nasal sprays are given) - We will make sure that the [facility] practice has signed all of the paperwork.   3. Return in about 6 months (around 09/28/2019). This can be an in-person, a virtual Webex or a telephone follow up visit.   Please inform us of any Emergency Department visits, hospitalizations, or changes in symptoms. Call us before going to the ED for breathing or allergy symptoms since we might be able to fit you in for a sick visit. Feel free to contact us anytime with any questions, problems, or concerns.  It was a pleasure to see you again today!  Websites that have reliable patient information: 1. American Academy of Asthma, Allergy, and Immunology: www.aaaai.org 2. Food Allergy Research and Education (FARE): foodallergy.org 3.  Mothers of Asthmatics: http://www.asthmacommunitynetwork.org 4. American College of Allergy, Asthma, and Immunology: www.acaai.org  "Like" [place] on Facebook and Instagram for our latest updates!      Make sure you are registered to vote! If you have moved or changed any of your contact information, you will need to get this updated before voting!  In some cases, you MAY be able to register to vote online: [email or web address].[email or web address]    Voter ID laws are NOT going into effect for the General Election in November 2020! DO NOT let this stop you from exercising your right to vote!   Absentee voting is the SAFEST way to vote during the coronavirus pandemic!   Download and print an absentee ballot request form at [email or web address]-[email or web address]-Request or you can scan the QR code below with your smart phone:      More information on absentee ballots can be found here: [email or web address]

## 2019-03-30 NOTE — Addendum Note (Signed)
Addended by: Neomia Dear on: 03/30/2019 04:36 PM   Modules accepted: Orders

## 2019-05-18 ENCOUNTER — Telehealth: Payer: Self-pay | Admitting: *Deleted

## 2019-05-18 NOTE — Telephone Encounter (Signed)
Patient's mom would like [place] to mail her next vial and mom would like [place] to mail a couple vials so they do not have to be mailed as often. Is this okay? Please advise.

## 2019-05-18 NOTE — Telephone Encounter (Signed)
That is fine with me. Does insurance allow that?   Salvatore Marvel, MD [facility]

## 2019-05-22 NOTE — Telephone Encounter (Signed)
Informed mom that the rest of Jeryl vials will be mailed out today. Vials placed in outgoing mail.

## 2019-05-22 NOTE — Telephone Encounter (Signed)
Awesome. That is fine with me.  Lydia Marvel, MD [facility]

## 2019-05-22 NOTE — Telephone Encounter (Signed)
They are already made so I will just send the rest of her 4 vial set if that's okay with you.

## 2019-10-23 NOTE — Progress Notes (Signed)
VIALS EXP 08-12-21 °

## 2019-10-24 DIAGNOSIS — J301 Allergic rhinitis due to pollen: Secondary | ICD-10-CM

## 2019-10-26 ENCOUNTER — Telehealth: Payer: Self-pay

## 2019-10-26 NOTE — Telephone Encounter (Signed)
Spoke to a nurse at [facility] and received allergy injection records for new vials to be ordered and mailed to [facility], [facility] and [employer]. Sent paperwork to scanning center.

## 2019-11-05 ENCOUNTER — Other Ambulatory Visit: Payer: Self-pay | Admitting: *Deleted

## 2019-11-05 ENCOUNTER — Ambulatory Visit: Payer: Self-pay | Admitting: *Deleted

## 2019-11-05 ENCOUNTER — Other Ambulatory Visit: Payer: Self-pay

## 2019-11-05 DIAGNOSIS — J309 Allergic rhinitis, unspecified: Secondary | ICD-10-CM

## 2019-11-05 MED ORDER — BUDESONIDE-FORMOTEROL FUMARATE 160-4.5 MCG/ACT IN AERO: 2.0000 | INHALATION_SPRAY | Freq: Two times a day (BID) | RESPIRATORY_TRACT | 5 refills | Status: DC

## 2019-11-05 MED ORDER — MONTELUKAST SODIUM 10 MG PO TABS: 10.0000 mg | ORAL_TABLET | Freq: Every day | ORAL | 5 refills | Status: DC

## 2019-11-05 NOTE — Progress Notes (Signed)
Immunotherapy   Patient Details  Name: Lydia Brown MRN: [id number] Date of Birth: 10-May-2001  11/05/2019  [name] here to pick up  MOLD-RW, G-W-T-C-D Following schedule: B  Frequency:2 times per week Epi-Pen: Yes Consent signed and patient instructions given. Patient's Green and Red vials have been mailed to [facility] at  [address], [place], [place] [address] along with new paperwork and shot records.   Lydia Brown 11/05/2019, 9:40 AM

## 2020-06-03 ENCOUNTER — Telehealth: Payer: Self-pay

## 2020-06-03 NOTE — Telephone Encounter (Signed)
Patient's mom called stating that Shaterrica has not been getting her allergy shots since May due to her being on a boat all summer. However, she is back in [place] and wanting to restart her allergy shots but [facility] returned her vials to her and informed her that she will have to come back to [place] to restart.  Mom states Amberly is in [place] and will not be coming back to [place] and is wondering why her vials were returned. Informed mom we would give [facility] a call and see if we can find out what is going on.

## 2020-06-03 NOTE — Telephone Encounter (Signed)
Office was closed today. Will call in the morning to determine why they gave the vials back to the patient and will update the patient.

## 2020-06-04 NOTE — Telephone Encounter (Signed)
Called and spoke with the receptionist at [facility] in [place]. She sent me to a nurses voicemail and I left a detailed voicemail explaining the situation for Monquie and asked for a nurse to call me back with further clarification to determine the next step with her injections.

## 2020-06-05 NOTE — Telephone Encounter (Signed)
Lydia Brown from [facility] called and stated that she spoke with a staff member from our practice and told them to give the vials to the patient to take back to our office to determine the next step. There did not know the name of whom told them this. She did state that Lydia Brown's last injection was 01/21/20 and she received 0.10. I called mom to determine which vial she received the injection from, if it was Green or Red. Mom is going to talk with Lydia Brown and determine which vial it was so I can forward the message to Dr. Dellis Anes to determine the best course of action to continue her injections. Patient's mother verbalized understanding and will call back.

## 2020-06-12 NOTE — Telephone Encounter (Signed)
Patient's mother called me back and stated that Lydia Brown has her Green and Red vials but does not remember what injection she received last in her green or red vials. I asked her to see if Lydia Brown has her shot records and if so to please call back with the last date of her injection with the dose and what color vial she received the injection from so we will know where to go from there. Patient's mother verbalized understanding and will call back with these details.

## 2020-06-13 NOTE — Telephone Encounter (Signed)
Patient's mother called back and stated that her last injection was 01/21/20 and she received .76mL out of her Green vials. She is going to e-mail me the records for [facility] so they can be uploaded into her chart. Once I review the e-mails I will review with the provider to determine the next step for her allergy injections. Mom is aware and verbalized understanding.

## 2020-06-13 NOTE — Telephone Encounter (Signed)
Patient's mother called back and stated that her last injection was 01/21/20 and she received .32mL out of her Green vials. Please advise where you would like for the patient to start back with her injections. Thank You.

## 2020-06-14 NOTE — Telephone Encounter (Signed)
Given that is has been nearly five months, I would probably go down to her Gold Vial 0.25 mL and advance on her previous schedule from there. Is that a possibility? If she only has the Green and Red vials, I suppose I would do 0.025 mL of her Tyrone Schimke.  Thanks, Malachi Bonds, MD [facility]

## 2020-06-16 NOTE — Telephone Encounter (Signed)
I will take care of this and contact the patient's mother and [facility] with the update.

## 2020-06-17 NOTE — Telephone Encounter (Signed)
Attempted to call patient's mother but was unable to get through, she may not have had service. Called patient's cell number and left a voicemail asking for her to return call to discuss. Will attempt to call mom back shortly.

## 2020-06-18 NOTE — Telephone Encounter (Signed)
Called and spoke with [facility] and advised per Dr. Dellis Anes that the patient can restart her Green vial at 0.057mL and wait 30 minutes, then she can continue to build up on Schedule B for her Chilton Si and then accordingly for when she starts her new Red vials. Nurse verbalized understanding and has made a notation in the patient's chart. She stated that all the patient needs to do is have her come back in with her vials and shot records and they will get her started back on her injections. Called and spoke with Lydia Brown the patient's mother and went over the plan for restarting her allergy injections. Patient's mother verbalized understanding and did confirm that Lydia Brown has been keeping her vials refrigerated the entire time. She will have [name] go back in to restart her Green vials and take her vials and shot records back to [facility] of [place].

## 2020-08-13 ENCOUNTER — Telehealth: Payer: Self-pay

## 2020-08-13 ENCOUNTER — Other Ambulatory Visit: Payer: Self-pay | Admitting: Allergy & Immunology

## 2020-08-13 NOTE — Telephone Encounter (Signed)
Called patient to have her set up an appointment. She was seen over a year ago and needs refills. Left message for her to call our office back.

## 2020-08-22 ENCOUNTER — Other Ambulatory Visit: Payer: Self-pay | Admitting: Allergy & Immunology

## 2020-09-21 ENCOUNTER — Other Ambulatory Visit: Payer: Self-pay | Admitting: Allergy & Immunology

## 2020-10-06 ENCOUNTER — Telehealth: Payer: Self-pay | Admitting: *Deleted

## 2020-10-06 NOTE — Telephone Encounter (Signed)
Patient needs a refill on Symbicort and Albuterol sent to [facility] pharmacy in [place] because she is completely out. Patient is scheduled for a visit on 10-09-2020 at 5:30pm.   Please advise.

## 2020-10-06 NOTE — Telephone Encounter (Signed)
I trust her to show up for her appointment.  That is totally fine.  Malachi Bonds, MD [facility]

## 2020-10-06 NOTE — Telephone Encounter (Signed)
Okay to refill medications? 

## 2020-10-06 NOTE — Telephone Encounter (Signed)
Patient's mother called and stated that Lydia Brown has now graduated from college and will be coming back to [place] to live and wanted to know what needs to be done with her allergy vials. I advised to have Symone bring in her allergy vials and shot records with her so we will know when to give her her next injection. Patient has an office visit this week with Dr. Dellis Anes and will bring them with her then.

## 2020-10-09 ENCOUNTER — Encounter: Payer: Self-pay | Admitting: Allergy & Immunology

## 2020-10-09 ENCOUNTER — Other Ambulatory Visit: Payer: Self-pay

## 2020-10-09 ENCOUNTER — Ambulatory Visit (INDEPENDENT_AMBULATORY_CARE_PROVIDER_SITE_OTHER): Payer: Managed Care, Other (non HMO) | Admitting: Allergy & Immunology

## 2020-10-09 VITALS — BP 102/76 | HR 93 | Temp 98.1°F | Resp 18 | Ht 68.0 in | Wt 146.8 lb

## 2020-10-09 DIAGNOSIS — J4540 Moderate persistent asthma, uncomplicated: Secondary | ICD-10-CM | POA: Diagnosis not present

## 2020-10-09 DIAGNOSIS — J3089 Other allergic rhinitis: Secondary | ICD-10-CM | POA: Diagnosis not present

## 2020-10-09 DIAGNOSIS — J302 Other seasonal allergic rhinitis: Secondary | ICD-10-CM

## 2020-10-09 MED ORDER — SYMBICORT 160-4.5 MCG/ACT IN AERO: 2.0000 | INHALATION_SPRAY | Freq: Two times a day (BID) | RESPIRATORY_TRACT | 5 refills | Status: DC

## 2020-10-09 MED ORDER — ALBUTEROL SULFATE HFA 108 (90 BASE) MCG/ACT IN AERS: 2.0000 | INHALATION_SPRAY | RESPIRATORY_TRACT | 1 refills | Status: DC | PRN

## 2020-10-09 MED ORDER — MONTELUKAST SODIUM 10 MG PO TABS: 10.0000 mg | ORAL_TABLET | Freq: Every day | ORAL | 5 refills | Status: DC

## 2020-10-09 MED ORDER — FLUTICASONE PROPIONATE 50 MCG/ACT NA SUSP: 2.0000 | Freq: Every day | NASAL | 5 refills | Status: DC

## 2020-10-09 MED ORDER — EPINEPHRINE 0.3 MG/0.3ML IJ SOAJ: 0.3000 mg | INTRAMUSCULAR | 1 refills | Status: DC | PRN

## 2020-10-09 NOTE — Progress Notes (Signed)
FOLLOW UP  Date of Service/Encounter:  10/09/20   Assessment:   Moderate persistent asthma, uncomplicated  Seasonal and perennial allergic rhinitis(horse, mouse, mixed feather, trees, weeds, grasses, indoor molds, dust mites, cat and dog)  Fully vaccinated against COVID-19  Plan/Recommendations:   1. Moderate persistent asthma, uncomplicated - Lung testing looked amazing today (but blunted, this seems to be your baseline). - Start prednisone pack provided today if restarting the Symbicort does not help completely.  - Daily controller medication(s): Symbicort 160/4.51mcg two puffs twice daily with spacer - Prior to physical activity: ProAir 2 puffs 10-15 minutes before physical activity. - Rescue medications: ProAir 4 puffs every 4-6 hours as needed - Asthma control goals:  * Full participation in all desired activities (may need albuterol before activity) * Albuterol use two time or less a week on average (not counting use with activity) * Cough interfering with sleep two time or less a month * Oral steroids no more than once a year * No hospitalizations  2. Chronic rhinitis (horse, mouse, mixed feather, trees, weeds, grasses, indoor molds, dust mites, cat and dog) - Continue taking: Singulair (montelukast) 10mg  daily and Flonase (fluticasone) two sprays per nostril daily AS NEEDED and Zyrtec (cetirizine) 10mg  daily as needed.  - You can use an extra dose of the antihistamine, if needed, for breakthrough symptoms.  - Consider nasal saline rinses 1-2 times daily to remove allergens from the nasal cavities as well as help with mucous clearance (this is especially helpful to do before the nasal sprays are given) - Come back when you get your vials back.  3. Return in about 6 months (around 04/08/2021).  Subjective:   Lydia Brown is a 20 y.o. female presenting today for follow up of  Chief Complaint  Patient presents with  . Follow-up    Lydia Brown has a history of  the following: Patient Active Problem List   Diagnosis Date Noted  . Seasonal and perennial allergic rhinitis 04/17/2018  . Moderate persistent asthma, uncomplicated 57/32/2025    History obtained from: chart review and patient.  Lydia Brown is a 20 y.o. female presenting for a follow up visit.  She was last seen in June 2020.  At that time, her lung testing looked amazing.  We continue with Symbicort 2 puffs twice daily with a spacer as well as ProAir as needed.  For her rhinitis, we continued with Singulair as well as Flonase and Zyrtec.  We worked on transferring her vials to [facility] in [place].  Since the last visit, she has done well. She actually graduated recently and is living back in [place] with her parents.   Asthma/Respiratory Symptom History: She has not needed prednisone for her breathing. She does need a spacer today. She does report some increased shortness of breath. She has not been using her Symbicort for the past week since she ran out of the medication and needed refills. She thought that she would be fine since she was seeing me soon, but her breathing has gotten progressively worse over the last week. ACT is 17 today, indicating subpar asthma control.   Allergic Rhinitis Symptom History: She has been on her allergy shots in [place]. She feels like they are helping. She is doing the service dog training and is doing better with her symptoms. She would never have been able to deal with all of those dogs prior to having the allergy shots.   Lydia Brown is on allergen immunotherapy. She receives two injections. Immunotherapy  script #1 contains trees, weeds, grasses, cat and dog. She currently receives 0.60mL of the RED vial (1/100). Immunotherapy script #2 contains ragweed and molds. She currently receives 0.65mL of the RED vial (1/100). She started shots March of 2020 and reached maintenance in the fall 2021. She does not have her recording sheet handy and she forgot her  vials in her friend's refrigerator. She feels pretty bad about this, but we reassure her and tell her that they can just send the vials to [place].   She is going to [employer] and is working on getting shadowing opportunities for this. Her father found something in [place], so she might be headed that way. Her mother is doing better, following the death of both her sister AND her father in 2020.   Otherwise, there have been no changes to her past medical history, surgical history, family history, or social history.    Review of Systems  Constitutional: Negative.  Negative for chills, fever, malaise/fatigue and weight loss.  HENT: Positive for congestion. Negative for ear discharge, ear pain and sinus pain.   Eyes: Negative for pain, discharge and redness.  Respiratory: Positive for cough and shortness of breath. Negative for sputum production and wheezing.   Cardiovascular: Negative.  Negative for chest pain and palpitations.  Gastrointestinal: Negative for abdominal pain, constipation, diarrhea, heartburn, nausea and vomiting.  Skin: Negative.  Negative for itching and rash.  Neurological: Negative for dizziness and headaches.  Endo/Heme/Allergies: Positive for environmental allergies. Does not bruise/bleed easily.       Objective:   Blood pressure 102/76, pulse 93, temperature 98.1 F (36.7 C), temperature source Temporal, resp. rate 18, height 5\' 8"  (1.727 m), weight 146 lb 12.8 oz (66.6 kg), SpO2 97 %. Body mass index is 22.32 kg/m.   Physical Exam:  Physical Exam Constitutional:      Appearance: She is well-developed.     Comments: Very pleasant female. Cooperative with the exam. Turbinates enlarged bilaterally.   HENT:     Head: Normocephalic and atraumatic.     Right Ear: Tympanic membrane, ear canal and external ear normal.     Left Ear: Tympanic membrane, ear canal and external ear normal.     Nose: No nasal deformity, septal deviation, mucosal edema, rhinorrhea or  epistaxis.     Right Turbinates: Enlarged and swollen.     Left Turbinates: Enlarged and swollen.     Right Sinus: No maxillary sinus tenderness or frontal sinus tenderness.     Left Sinus: No maxillary sinus tenderness or frontal sinus tenderness.     Mouth/Throat:     Mouth: Oropharynx is clear and moist. Mucous membranes are not pale and not dry.     Pharynx: Uvula midline.  Eyes:     General:        Right eye: No discharge.        Left eye: No discharge.     Extraocular Movements: EOM normal.     Conjunctiva/sclera: Conjunctivae normal.     Right eye: Right conjunctiva is not injected. No chemosis.    Left eye: Left conjunctiva is not injected. No chemosis.    Pupils: Pupils are equal, round, and reactive to light.  Cardiovascular:     Rate and Rhythm: Normal rate and regular rhythm.     Heart sounds: Normal heart sounds.  Pulmonary:     Effort: Pulmonary effort is normal. No tachypnea, accessory muscle usage or respiratory distress.     Breath sounds: Normal breath sounds.  No wheezing, rhonchi or rales.     Comments: Moving air well in all lung fields. No increased work of breathing noted.  Chest:     Chest wall: No tenderness.  Lymphadenopathy:     Cervical: No cervical adenopathy.  Skin:    General: Skin is warm.     Capillary Refill: Capillary refill takes less than 2 seconds.     Coloration: Skin is not pale.     Findings: No abrasion, erythema, petechiae or rash. Rash is not papular, urticarial or vesicular.     Comments: No eczematous or urticarial lesions noted.   Neurological:     Mental Status: She is alert.  Psychiatric:        Mood and Affect: Mood and affect normal.        Behavior: Behavior is cooperative.      Diagnostic studies:    Spirometry: results normal (FEV1: 3.48/94%, FVC: 4.41/103%, FEV1/FVC: 79%).    Spirometry consistent with normal pattern.   Allergy Studies: none       Malachi Bonds, MD  [facility]

## 2020-10-09 NOTE — Patient Instructions (Addendum)
1. Moderate persistent asthma, uncomplicated - Lung testing looked amazing today (but blunted, this seems to be your baseline). - Start prednisone pack provided today if restarting the Symbicort does not help completely.  - Daily controller medication(s): Symbicort 160/4.17mcg two puffs twice daily with spacer - Prior to physical activity: ProAir 2 puffs 10-15 minutes before physical activity. - Rescue medications: ProAir 4 puffs every 4-6 hours as needed - Asthma control goals:  * Full participation in all desired activities (may need albuterol before activity) * Albuterol use two time or less a week on average (not counting use with activity) * Cough interfering with sleep two time or less a month * Oral steroids no more than once a year * No hospitalizations  2. Chronic rhinitis (horse, mouse, mixed feather, trees, weeds, grasses, indoor molds, dust mites, cat and dog) - Continue taking: Singulair (montelukast) 10mg  daily and Flonase (fluticasone) two sprays per nostril daily AS NEEDED and Zyrtec (cetirizine) 10mg  daily as needed.  - You can use an extra dose of the antihistamine, if needed, for breakthrough symptoms.  - Consider nasal saline rinses 1-2 times daily to remove allergens from the nasal cavities as well as help with mucous clearance (this is especially helpful to do before the nasal sprays are given) - Come back when you get your vials back.  3. Return in about 6 months (around 04/08/2021).    Please inform of any Emergency Department visits, hospitalizations, or changes in symptoms. Call 06/09/2021 before going to the ED for breathing or allergy symptoms since we might be able to fit you in for a sick visit. Feel free to contact us anytime with any questions, problems, or concerns.  It was a pleasure to see you again today!  Websites that have reliable patient information: 1. American Academy of Asthma, Allergy, and Immunology: www.aaaai.org 2. Food Allergy Research and Education  (FARE): foodallergy.org 3. Mothers of Asthmatics: http://www.asthmacommunitynetwork.org 4. American College of Allergy, Asthma, and Immunology: www.acaai.org   COVID-19 Vaccine Information can be found at: [place] For questions related to vaccine distribution or appointments, please email [email or web address] or call [phone].     "Like" [email or web address] on Facebook and Instagram for our latest updates!       Make sure you are registered to vote! If you have moved or changed any of your contact information, you will need to get this updated before voting!  In some cases, you MAY be able to register to vote online: [phone]

## 2020-10-10 ENCOUNTER — Encounter: Payer: Self-pay | Admitting: Allergy & Immunology

## 2020-11-23 ENCOUNTER — Other Ambulatory Visit: Payer: Self-pay | Admitting: Allergy & Immunology

## 2021-05-26 ENCOUNTER — Other Ambulatory Visit: Payer: Self-pay | Admitting: *Deleted

## 2021-05-26 MED ORDER — SYMBICORT 160-4.5 MCG/ACT IN AERO: 2.0000 | INHALATION_SPRAY | Freq: Two times a day (BID) | RESPIRATORY_TRACT | 5 refills | Status: DC

## 2021-05-26 MED ORDER — MONTELUKAST SODIUM 10 MG PO TABS: 10.0000 mg | ORAL_TABLET | Freq: Every day | ORAL | 5 refills | Status: DC

## 2021-12-28 ENCOUNTER — Other Ambulatory Visit: Payer: Self-pay | Admitting: Allergy & Immunology

## 2022-02-04 ENCOUNTER — Other Ambulatory Visit: Payer: Self-pay | Admitting: Allergy & Immunology

## 2022-02-12 ENCOUNTER — Encounter: Payer: Self-pay | Admitting: Allergy & Immunology

## 2022-02-12 ENCOUNTER — Ambulatory Visit (INDEPENDENT_AMBULATORY_CARE_PROVIDER_SITE_OTHER): Payer: Self-pay | Admitting: Allergy & Immunology

## 2022-02-12 DIAGNOSIS — J4540 Moderate persistent asthma, uncomplicated: Secondary | ICD-10-CM

## 2022-02-12 DIAGNOSIS — J3089 Other allergic rhinitis: Secondary | ICD-10-CM

## 2022-02-12 DIAGNOSIS — J302 Other seasonal allergic rhinitis: Secondary | ICD-10-CM

## 2022-02-12 MED ORDER — BUDESONIDE-FORMOTEROL FUMARATE 160-4.5 MCG/ACT IN AERO: 2.0000 | INHALATION_SPRAY | Freq: Two times a day (BID) | RESPIRATORY_TRACT | 11 refills | Status: DC

## 2022-02-12 MED ORDER — MONTELUKAST SODIUM 10 MG PO TABS: 10.0000 mg | ORAL_TABLET | Freq: Every day | ORAL | 11 refills | Status: DC

## 2022-02-12 MED ORDER — COMPACT SPACE CHAMBER/LG MASK DEVI: 1.0000 | Freq: Every day | 0 refills | Status: AC | PRN

## 2022-02-12 MED ORDER — ALBUTEROL SULFATE HFA 108 (90 BASE) MCG/ACT IN AERS: 2.0000 | INHALATION_SPRAY | RESPIRATORY_TRACT | 1 refills | Status: AC | PRN

## 2022-02-12 NOTE — Addendum Note (Signed)
Addended by: Alfonse Spruce on: 02/12/2022 03:52 PM ? ? Modules accepted: Orders ? ?

## 2022-02-12 NOTE — Progress Notes (Signed)
? ?RE: Lydia Brown MRN: [id number] DOB: [birth year] ?Date of Telemedicine Visit: 02/12/2022 ? ?Referring provider: Diamantina Monks, MD ?Primary care provider: Diamantina Monks, MD ? ?Chief Complaint: Medication Management and Breathing Problem ? ? ?Telemedicine Follow Up Visit via Telephone: ?I connected with Lydia Brown for a follow up on 02/12/22 by telephone and verified that I am speaking with the correct person using two identifiers. ?  ?I discussed the limitations, risks, security and privacy concerns of performing an evaluation and management service by telephone and the availability of in person appointments. I also discussed with the patient that there may be a patient responsible charge related to this service. The patient expressed understanding and agreed to proceed. ? ?Patient is at her workplace.  ?Provider is at the office.  ?Visit start time: 12:05 PM ?Visit end time: 12:19 PM ?Insurance consent/check in by: Dr. Reece Agar ?Medical consent and medical assistant/nurse: Dr. Reece Agar ? ?History of Present Illness: ? ?She is a 21 y.o. female, who is being followed for allergic rhinitis as well as persistent asthma. Her previous allergy office visit was in January 2022 with myself.  She was last seen in January 2022.  At that time, her lung testing looked amazing.  We gave her a prednisone pack in case restarting her Symbicort did not help with her symptoms.  She had been out of her medication for a few weeks at that point.  For her allergic rhinitis, we continue with Singulair as well as Flonase and Zyrtec.  She was on allergy shots at an outside practice in [place]. ? ?Since the last visit, she has done well.  ? ?Asthma/Respiratory Symptom History: She ran out of her Symbicort and her montelukast two days ago. She thought it was ready at the pharmacy. But then it turns out that they needed a refill.  She was doing two puffs twice daily of the Symbicort. She definitely noticed when she was out of the medication. She was very  concerned about her lung function.  We will mostly concerns her is how quickly she decompensated without her Symbicort.  She is actually interested in trying out a biologic as well to help the Symbicort worked better. ? ?Allergic Rhinitis Symptom History: Her allergic rhinitis is well controlled. She stopped the shots around the summer 2022.  She was moving around a lot and she went to [place] for three weeks, so she felt that she was going to get behind and she stopped.  ? ?She has not needed antibiotics at all since the last visit. She has no history of eczema. She thinks that her sister has started Dupixent. ? 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 She is mostly applying to [place] schools.  For some reason, she likes [place]. ? ?Otherwise, there have been no changes to her past medical history, surgical history, family history, or social history. ? ?Assessment and Plan: ? ?Lydia Brown is a 21 y.o. female with: ? ?  ?Moderate persistent asthma, uncomplicated ?  ?Seasonal and perennial allergic rhinitis (horse, mouse, mixed feather, trees, weeds, grasses, indoor molds, dust mites, cat and dog) ?  ?Fully vaccinated against COVID-19  ? ? ?Lydia Brown presents for follow-up visit.  She was very stable on her Symbicort and her montelukast.  She uses her allergy medications on a as needed basis.  She has been out of her  controller medications for 2 or 3 days and her symptoms have definitely worsened.  She actually is interested in starting a controller medication for her asthma.  We have no labs from her, so I ordered a CBC with differential and an IgE level.  There is a [facility] in [place] and she is going to stop by they are get her labs done. ?  ? ?Diagnostics: ?None. ? ?Medication List:  ?Current Outpatient Medications  ?Medication Sig Dispense Refill  ? Dapsone 7.5 % GEL Apply 1  application. topically in the morning and at bedtime.    ? albuterol (VENTOLIN HFA) 108 (90 Base) MCG/ACT inhaler Inhale 2 puffs into the lungs every 4 (four) hours as needed for wheezing or shortness of breath. 18 g 1  ? budesonide-formoterol (SYMBICORT) 160-4.5 MCG/ACT inhaler Inhale 2 puffs into the lungs 2 (two) times daily. 1 each 11  ? cetirizine (ZYRTEC) 10 MG tablet Take 1 tablet (10 mg total) by mouth daily. 30 tablet 5  ? fluticasone (FLONASE) 50 MCG/ACT nasal spray Place 2 sprays into both nostrils daily. 16 g 5  ? montelukast (SINGULAIR) 10 MG tablet Take 1 tablet (10 mg total) by mouth at bedtime. 30 tablet 11  ? SYMBICORT 160-4.5 MCG/ACT inhaler INHALE 2 PUFFS INTO THE LUNGS IN THE MORNING AND AT BEDTIME 10.2 g 0  ? tretinoin (RETIN-A) 0.025 % cream     ? ?No current facility-administered medications for this visit.  ? ?Allergies: ?No Known Allergies ?I reviewed her past medical history, social history, family history, and environmental history and no significant changes have been reported from previous visits. ? ?Review of Systems  ?Constitutional:  Negative for activity change and appetite change.  ?HENT:  Negative for congestion, postnasal drip, rhinorrhea, sinus pressure and sore throat.   ?Eyes:  Negative for pain, discharge, redness and itching.  ?Respiratory:  Positive for cough, shortness of breath and wheezing. Negative for stridor.   ?Gastrointestinal:  Negative for diarrhea, nausea and vomiting.  ?Endocrine: Negative for cold intolerance and heat intolerance.  ?Musculoskeletal:  Negative for arthralgias, joint swelling and myalgias.  ?Skin:  Negative for rash.  ?Allergic/Immunologic: Negative for environmental allergies and food allergies.  ? ?Objective: ? ?Physical exam not obtained as encounter was done via telephone.  ? ?Previous notes and tests were reviewed. ? ?I discussed the assessment and treatment plan with the patient. The patient was provided an opportunity to ask questions and  all were answered. The patient agreed with the plan and demonstrated an understanding of the instructions. ?  ?The patient was advised to call back or seek an in-person evaluation if the symptoms worsen or if the condition fails to improve as anticipated. ? ?I provided 14 minutes of non-face-to-face time during this encounter. ? ?It was my pleasure to participate in [employer]'s care today. Please feel free to contact me with any questions or concerns.  ? ?Sincerely, ? ?Alfonse Spruce, MD ?

## 2022-06-16 ENCOUNTER — Telehealth: Payer: Self-pay | Admitting: Allergy & Immunology

## 2022-06-16 MED ORDER — CETIRIZINE HCL 10 MG PO TABS: 10.0000 mg | ORAL_TABLET | Freq: Every day | ORAL | 2 refills | Status: DC

## 2022-06-16 NOTE — Telephone Encounter (Signed)
Patient called requesting that I try to send in cetirizine to her pharmacy to see if this was covered at all. I sent in the script.   Malachi Bonds, MD [facility]

## 2022-10-22 ENCOUNTER — Ambulatory Visit (INDEPENDENT_AMBULATORY_CARE_PROVIDER_SITE_OTHER): Payer: BC Managed Care – PPO | Admitting: Allergy & Immunology

## 2022-10-22 ENCOUNTER — Encounter: Payer: Self-pay | Admitting: Allergy & Immunology

## 2022-10-22 DIAGNOSIS — J3089 Other allergic rhinitis: Secondary | ICD-10-CM

## 2022-10-22 DIAGNOSIS — J4540 Moderate persistent asthma, uncomplicated: Secondary | ICD-10-CM | POA: Diagnosis not present

## 2022-10-22 DIAGNOSIS — J302 Other seasonal allergic rhinitis: Secondary | ICD-10-CM

## 2022-10-22 MED ORDER — MONTELUKAST SODIUM 10 MG PO TABS: 10.0000 mg | ORAL_TABLET | Freq: Every day | ORAL | 4 refills | Status: AC

## 2022-10-22 MED ORDER — SYMBICORT 160-4.5 MCG/ACT IN AERO: 2.0000 | INHALATION_SPRAY | Freq: Two times a day (BID) | RESPIRATORY_TRACT | 4 refills | Status: DC

## 2022-10-22 MED ORDER — CETIRIZINE HCL 10 MG PO TABS: 10.0000 mg | ORAL_TABLET | Freq: Every day | ORAL | 4 refills | Status: AC

## 2022-10-22 NOTE — Patient Instructions (Addendum)
1. Moderate persistent asthma, uncomplicated - Lung testing not done since this was a televisit.  - We would like a spiro next time you are in town.  - Daily controller medication(s): Symbicort 160/4.39mcg two puffs twice daily with spacer - Prior to physical activity: albuterol 2 puffs 10-15 minutes before physical activity. - Rescue medications: albuterol 4 puffs every 4-6 hours as needed - Asthma control goals:  * Full participation in all desired activities (may need albuterol before activity) * Albuterol use two time or less a week on average (not counting use with activity) * Cough interfering with sleep two time or less a month * Oral steroids no more than once a year * No hospitalizations  2. Chronic rhinitis (horse, mouse, mixed feather, trees, weeds, grasses, indoor molds, dust mites, cat and dog) - Continue taking: Singulair (montelukast) 10mg  daily and Zyrtec (cetirizine) 10mg  daily.  - You can use an extra dose of the antihistamine, if needed, for breakthrough symptoms.   3. Return in about 6 months (around 04/22/2023).    Please inform us of any Emergency Department visits, hospitalizations, or changes in symptoms. Call us before going to the ED for breathing or allergy symptoms since we might be able to fit you in for a sick visit. Feel free to contact us anytime with any questions, problems, or concerns.  It was a pleasure to see you again today!  Websites that have reliable patient information: 1. American Academy of Asthma, Allergy, and Immunology: www.aaaai.org 2. Food Allergy Research and Education (FARE): foodallergy.org 3. Mothers of Asthmatics: http://www.asthmacommunitynetwork.org 4. American College of Allergy, Asthma, and Immunology: www.acaai.org   COVID-19 Vaccine Information can be found at: ShippingScam.co.uk For questions related to vaccine distribution or appointments, please email  [email or web address] or call [phone].     "Like" [place] on Facebook and Instagram for our latest updates!       Make sure you are registered to vote! If you have moved or changed any of your contact information, you will need to get this updated before voting!  In some cases, you MAY be able to register to vote online: [email or web address]

## 2022-10-22 NOTE — Progress Notes (Signed)
RE: Lydia Brown MRN: [id number] DOB: 06/07/2001 Date of Telemedicine Visit: 10/22/2022  Referring provider: Dion Body, MD Primary care provider: Dion Body, MD  Chief Complaint: Medication Management and Medication Refill   Telemedicine Follow Up Visit via FaceTime: I connected with Lydia Brown for a follow up on 10/22/22 by FaceTime Video and verified that I am speaking with the correct person using two identifiers.   I discussed the limitations, risks, security and privacy concerns of performing an evaluation and management service by telemedicine and the availability of in person appointments. I also discussed with the patient that there may be a patient responsible charge related to this service. The patient expressed understanding and agreed to proceed.  Patient is at home.  Provider is at the office.  Visit start time: 1:00 PM Visit end time: 1:21 PM Insurance consent/check in by: Dr. Darnell Level Medical consent and medical assistant/nurse: Dr. Darnell Level  History of Present Illness:  She is a 22 y.o. female, who is being followed for moderate persistent asthma as well as seasonal and perennial allergic rhinitis. Her previous allergy office visit was in May 2023 with myself.  At that time, she was doing very well on Symbicort and montelukast.  She has been out of her medications for 2 or 3 days and her symptoms definitely worsened.  She was interested in starting an injectable medication for her asthma, so I ordered a CBC with differential and IgE level.  These were never collected.  Since the last visit, she has done well. She remains in [place]. 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 She has been enjoying it a lot. 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 This is going to be 2 years and 3 months.   Asthma/Respiratory Symptom History: She remains on the Symbicort two puffs twice daily. She has work Insurance underwriter which covers it. She is on [employer]. She has not needed  prednisone at all since I talked to her last time.  As long she takes her Symbicort, she actually does very well.  She has not been to the hospital and has not needed prednisone in quite some time.  She is worried about long-term effects of Symbicort, but feels reassured when I tell her that many people are on it for decades without a problem.  She did have some questions about disease modifying treatments.     Allergic Rhinitis Symptom History: She remains on the cetirizine 10mg  at night.  She also is on Singulair 10 mg daily.  She does need refills of everything.  She has not been on antibiotics.  She has been on allergy shots in the past, but when she tried to get on them in [place] she was told that it probably was not worth the effort since she was going to be there for a small amount of time.  She was also told that it takes a few years for people to become allergic to their new environment, so allergy shots when he living somewhere for less than several years is probably not worth it.  She had wisdom teeth removed in December 2021. She had only two on the left or right side.   Otherwise, there have been no changes to her past medical history, surgical history, family history, or social history.  Assessment and Plan:  Leveta is a 22 y.o. female with:  Moderate persistent asthma, uncomplicated   Seasonal and perennial allergic rhinitis (horse, mouse, mixed feather, trees,  weeds, grasses, indoor molds, dust mites, cat and dog)   Fully vaccinated against COVID-19     1. Moderate persistent asthma, uncomplicated - Lung testing not done since this was a televisit.  - We would like a spiro next time you are in town.  - Daily controller medication(s): Symbicort 160/4.94mcg two puffs twice daily with spacer - Prior to physical activity: albuterol 2 puffs 10-15 minutes before physical activity. - Rescue medications: albuterol 4 puffs every 4-6 hours as needed - Asthma control goals:  * Full  participation in all desired activities (may need albuterol before activity) * Albuterol use two time or less a week on average (not counting use with activity) * Cough interfering with sleep two time or less a month * Oral steroids no more than once a year * No hospitalizations  2. Chronic rhinitis (horse, mouse, mixed feather, trees, weeds, grasses, indoor molds, dust mites, cat and dog) - Continue taking: Singulair (montelukast) 10mg  daily and Zyrtec (cetirizine) 10mg  daily.  - You can use an extra dose of the antihistamine, if needed, for breakthrough symptoms.   3. Return in about 6 months (around 04/22/2023).    Diagnostics: None.  Medication List:  Current Outpatient Medications  Medication Sig Dispense Refill   albuterol (VENTOLIN HFA) 108 (90 Base) MCG/ACT inhaler Inhale 2 puffs into the lungs every 4 (four) hours as needed for wheezing or shortness of breath. 18 g 1   cetirizine (ZYRTEC) 10 MG tablet Take 1 tablet (10 mg total) by mouth daily. 90 tablet 4   Dapsone 7.5 % GEL Apply 1 application. topically in the morning and at bedtime.     fluticasone (FLONASE) 50 MCG/ACT nasal spray Place 2 sprays into both nostrils daily. 16 g 5   montelukast (SINGULAIR) 10 MG tablet Take 1 tablet (10 mg total) by mouth at bedtime. 90 tablet 4   Spacer/Aero-Holding Chambers (COMPACT SPACE CHAMBER/LG MASK) DEVI 1 each by Does not apply route daily as needed. 1 each 0   SYMBICORT 160-4.5 MCG/ACT inhaler Inhale 2 puffs into the lungs in the morning and at bedtime. 3 each 4   tretinoin (RETIN-A) 0.025 % cream      No current facility-administered medications for this visit.   Allergies: No Known Allergies I reviewed her past medical history, social history, family history, and environmental history and no significant changes have been reported from previous visits.  Review of Systems  Constitutional:  Negative for activity change, appetite change, chills, fatigue and fever.  HENT:  Negative  for congestion, postnasal drip, rhinorrhea, sinus pressure and sore throat.   Eyes:  Negative for pain, discharge, redness and itching.  Respiratory:  Negative for cough, shortness of breath, wheezing and stridor.   Gastrointestinal:  Negative for diarrhea, nausea and vomiting.  Endocrine: Negative for cold intolerance and heat intolerance.  Musculoskeletal:  Negative for arthralgias, joint swelling and myalgias.  Skin:  Negative for rash.  Allergic/Immunologic: Negative for environmental allergies and food allergies.    Objective:  Physical exam not obtained as encounter was done via telephone.   Previous notes and tests were reviewed.  I discussed the assessment and treatment plan with the patient. The patient was provided an opportunity to ask questions and all were answered. The patient agreed with the plan and demonstrated an understanding of the instructions.   The patient was advised to call back or seek an in-person evaluation if the symptoms worsen or if the condition fails to improve as anticipated.  I provided  21 minutes of non-face-to-face time during this encounter.  It was my pleasure to participate in [place]'s care today. Please feel free to contact me with any questions or concerns.   Sincerely,  Valentina Shaggy, MD

## 2022-12-13 ENCOUNTER — Other Ambulatory Visit: Payer: Self-pay | Admitting: Allergy & Immunology

## 2023-11-20 ENCOUNTER — Telehealth: Payer: Self-pay | Admitting: Allergy & Immunology

## 2023-11-20 MED ORDER — BUDESONIDE-FORMOTEROL FUMARATE 160-4.5 MCG/ACT IN AERO: 2.0000 | INHALATION_SPRAY | Freq: Two times a day (BID) | RESPIRATORY_TRACT | 5 refills | Status: AC

## 2023-11-20 NOTE — Telephone Encounter (Signed)
Patient contacted me requesting a refill on her Symbicort. I sent it into the pharmacy that she requested.   She does need an office visit. She is going to check her schedule to see when she is going to be in town next and she will get back to [place].   Malachi Bonds, MD [facility]

## 2024-05-03 ENCOUNTER — Other Ambulatory Visit: Payer: Self-pay | Admitting: Allergy & Immunology

## 2024-08-09 ENCOUNTER — Other Ambulatory Visit: Payer: Self-pay | Admitting: Allergy & Immunology
